# Patient Record
Sex: Male | Born: 1960 | Race: White | Hispanic: No | Marital: Married
Health system: Southern US, Community
[De-identification: ages and names within clinical notes are randomized; demographics above are authoritative.]

---

## 2021-05-21 ENCOUNTER — Encounter (HOSPITAL_COMMUNITY): Payer: Self-pay

## 2021-05-21 ENCOUNTER — Emergency Department (HOSPITAL_COMMUNITY)
Admission: EM | Admit: 2021-05-21 | Discharge: 2021-05-22 | Disposition: A | Discharge status: Home / Self Care | Payer: BC Managed Care – PPO | Attending: Emergency Medicine | Admitting: Emergency Medicine

## 2021-05-21 ENCOUNTER — Emergency Department (HOSPITAL_COMMUNITY): Payer: BC Managed Care – PPO

## 2021-05-21 DIAGNOSIS — M545 Low back pain, unspecified: Secondary | ICD-10-CM | POA: Insufficient documentation

## 2021-05-21 DIAGNOSIS — C720 Malignant neoplasm of spinal cord: Secondary | ICD-10-CM | POA: Diagnosis not present

## 2021-05-21 DIAGNOSIS — I10 Essential (primary) hypertension: Secondary | ICD-10-CM | POA: Diagnosis not present

## 2021-05-21 DIAGNOSIS — D492 Neoplasm of unspecified behavior of bone, soft tissue, and skin: Secondary | ICD-10-CM

## 2021-05-21 DIAGNOSIS — M5442 Lumbago with sciatica, left side: Secondary | ICD-10-CM

## 2021-05-21 MED ORDER — FENTANYL CITRATE PF 50 MCG/ML IJ SOSY
50.0000 ug | PREFILLED_SYRINGE | Freq: Once | INTRAMUSCULAR | Status: AC
  Administered 2021-05-21: 50 ug via INTRAVENOUS
  Filled 2021-05-21: qty 1

## 2021-05-21 MED ORDER — GADOBUTROL 1 MMOL/ML IV SOLN
10.0000 mL | Freq: Once | INTRAVENOUS | Status: AC | PRN
  Administered 2021-05-21: 10 mL via INTRAVENOUS

## 2021-05-21 NOTE — ED Provider Notes (Signed)
Emergency Medicine Provider Triage Evaluation Note  Antonio Woodard , a 60 y.o. male  was evaluated in triage.  Pt complains of bilateral leg pain times several days.  Patient has known spinal tumor, states his pain is normally manageable.  Every now and then he has a flare that is requiring prednisone.  His pain is made worse with sitting in certain positions.  Is unchanged with ambulation.  No numbness, tingling, urinary retention, incontinence of bowel or bladder.  Review of Systems  Positive: Back pain, bilateral leg pain Negative: Leg swelling, numbness, tingling, urinary retention, incontinence  Physical Exam  BP 110/78 (BP Location: Left Arm)   Pulse 84   Temp 98 F (36.7 C) (Oral)   Resp 18   SpO2 100%  Gen:   Awake, no distress   Resp:  Normal effort  MSK:   Moves extremities without difficulty  Other:  5/5 strength in all extremities, sensation intact bilaterally  Medical Decision Making  Medically screening exam initiated at 5:47 PM.  Appropriate orders placed.  Antonio Woodard was informed that the remainder of the evaluation will be completed by another provider, this initial triage assessment does not replace that evaluation, and the importance of remaining in the ED until their evaluation is complete.  Discussed with attending physician Dr. Pearline Cables, plan to obtain lumbar MRI    Antonio Plummer, PA-C 05/21/21 [place], DO 05/21/21 2248

## 2021-05-21 NOTE — ED Triage Notes (Signed)
Pt arrived via POV, states has a known spine tumor, pain normally manageable, states he had several flights were he was sitting in positions for extended periods of time, now pain worsening. States took prednisone with no relief.

## 2021-05-22 ENCOUNTER — Telehealth (HOSPITAL_COMMUNITY): Payer: Self-pay | Admitting: Emergency Medicine

## 2021-05-22 MED ORDER — PREDNISONE 50 MG PO TABS: 50.0000 mg | ORAL_TABLET | Freq: Every day | ORAL | 0 refills | Status: AC

## 2021-05-22 MED ORDER — HYDROCODONE-ACETAMINOPHEN 5-325 MG PO TABS: 1.0000 | ORAL_TABLET | ORAL | 0 refills | Status: DC | PRN

## 2021-05-22 MED ORDER — METHYLPREDNISOLONE SODIUM SUCC 125 MG IJ SOLR
125.0000 mg | Freq: Once | INTRAMUSCULAR | Status: AC
  Administered 2021-05-22: 125 mg via INTRAVENOUS
  Filled 2021-05-22: qty 2

## 2021-05-22 MED ORDER — HYDROCODONE-ACETAMINOPHEN 5-325 MG PO TABS: 1.0000 | ORAL_TABLET | Freq: Four times a day (QID) | ORAL | 0 refills | Status: AC | PRN

## 2021-05-22 MED ORDER — HYDROCODONE-ACETAMINOPHEN 5-325 MG PO TABS
1.0000 | ORAL_TABLET | Freq: Once | ORAL | Status: AC
  Administered 2021-05-22: 1 via ORAL
  Filled 2021-05-22: qty 1

## 2021-05-22 NOTE — Discharge Instructions (Signed)
Please see your neurosurgeon after you return to [place].

## 2021-05-22 NOTE — ED Provider Notes (Signed)
[facility] DEPT Provider Note   CSN: [id number] Arrival date & time: 05/21/21  1634     History Chief Complaint  Patient presents with   Back Pain    Antonio Woodard is a 60 y.o. male.  The history is provided by the patient.  Back Pain He has a history of a tumor of the lumbar spine which is being followed by a neurosurgeon in [place].  He traveled here to attend the furniture market, but noted severe low back pain on trying to get off of the plane.  Pain shoots down his left leg and it is unbearable.  He rates it as high as 8/10.  He is able to find a position where he is pain-free, but pain gets much worse as he tries to stand up or walk.  He denies any weakness or numbness and denies any bowel or bladder dysfunction.  This has happened before and usually responds to a course of steroids.  He called his neurosurgeon who prescribed a Medrol Dosepak which has not helped.   History reviewed. No pertinent past medical history.  There are no problems to display for this patient.   ** The histories are not reviewed yet. Please review them in the "History" navigator section and refresh this [place].   History reviewed. No pertinent family history.     Home Medications Prior to Admission medications   Not on File    Allergies    Patient has no known allergies.  Review of Systems   Review of Systems  Musculoskeletal:  Positive for back pain.  All other systems reviewed and are negative.  Physical Exam Updated Vital Signs BP 110/78 (BP Location: Left Arm)   Pulse 84   Temp 98 F (36.7 C) (Oral)   Resp 18   SpO2 100%   Physical Exam Vitals and nursing note reviewed.  60 year old male, resting comfortably and in no acute distress. Vital signs are significant for elevated blood pressure. Oxygen saturation is 100%, which is normal. Head is normocephalic and atraumatic. PERRLA, EOMI. Oropharynx is clear. Neck is nontender and supple  without adenopathy or JVD. Back is nontender and there is no CVA tenderness.  Straight leg raise is positive on the left at 45 degrees. Lungs are clear without rales, wheezes, or rhonchi. Chest is nontender. Heart has regular rate and rhythm without murmur. Abdomen is soft, flat, nontender. Extremities have no cyanosis or edema, full range of motion is present. Skin is warm and dry without rash. Neurologic: Mental status is normal, cranial nerves are intact, there are no motor or sensory deficits.  ED Results / Procedures / Treatments    Radiology MR Lumbar Spine W Wo Contrast  Result Date: 05/21/2021 CLINICAL DATA:  Known spinal tumor, concern for progression EXAM: MRI LUMBAR SPINE WITHOUT AND WITH CONTRAST TECHNIQUE: Multiplanar and multiecho pulse sequences of the lumbar spine were obtained without and with intravenous contrast. CONTRAST:  68mL GADAVIST GADOBUTROL 1 MMOL/ML IV SOLN COMPARISON:  No prior MRI available for comparison. FINDINGS: Evaluation is somewhat limited by motion artifact. Segmentation: Standard. Alignment: Straightening of the normal lumbar lordosis. No listhesis. Vertebrae: No fracture, evidence of discitis, or bone lesion. No abnormal enhancement. Conus medullaris and cauda equina: Conus extends to the L1 level. At the L2 level, with in the spinal canal, there is a T1 isointense, T2 heterogeneous, avidly enhancing lesion that measures up to 1.0 x 1.6 x 2.3 cm (AP x TR x CC) (series 7, image  14 and series 10, image 9). Possible dural tail. The lesion occupies the majority of the AP diameter of the spinal canal, causing significant crowding and displacement of the nerve roots at this level, with tortuous nerve roots seen superior to L2. No abnormal nerve root enhancement. Paraspinal and other soft tissues: Negative. Disc levels: T12-L1: No significant disc bulge. No spinal canal stenosis or neural foraminal narrowing. L1-L2: No significant disc bulge. Intradural mass, as  described above. No osseous spinal canal stenosis. No neural foraminal narrowing. L2-L3: No significant disc bulge. No spinal canal stenosis or neural foraminal narrowing. L3-L4: Mild disc desiccation and mild disc bulge. Mild facet arthropathy. No spinal canal stenosis or neural foraminal narrowing. L4-L5: No significant disc bulge. No spinal canal stenosis or neural foraminal narrowing. L5-S1: Minimal disc bulge. No spinal canal stenosis or neural foraminal narrowing. IMPRESSION: Intradural lesion, measuring up to 2.3 cm at the L2 level, which causes significant crowding and displacement of the nerve roots. This could represent a meningioma, schwannoma, or myxopapillary ependymoma. Without prior imaging for comparison, it is difficult to determine if this has progressed. Electronically Signed   By: Merilyn Baba M.D.   On: 05/21/2021 19:47    Procedures Procedures   Medications Ordered in ED Medications  HYDROcodone-acetaminophen (NORCO/VICODIN) 5-325 MG per tablet 1 tablet (has no administration in time range)  methylPREDNISolone sodium succinate (SOLU-MEDROL) 125 mg/2 mL injection 125 mg (has no administration in time range)  fentaNYL (SUBLIMAZE) injection 50 mcg (50 mcg Intravenous Given 05/21/21 1845)  gadobutrol (GADAVIST) 1 MMOL/ML injection 10 mL (10 mLs Intravenous Contrast Given 05/21/21 1908)    ED Course  I have reviewed the triage vital signs and the nursing notes.  Pertinent imaging results that were available during my care of the patient were reviewed by me and considered in my medical decision making (see chart for details).   MDM Rules/Calculators/A&P                         Low back pain with left-sided sciatica secondary to known spinal tumor.  MRI had been obtained showing a 2.3 cm intradural lesion at the L2 level causing crowding and displacement of nerve roots and apparently is what is causing his pain.  Unfortunately, records from his neurosurgeon in [place] are not  available through [facility].  He will be given a dose of methylprednisolone and discharged on a higher dose of prednisone, will also give prescription for hydrocodone-acetaminophen for pure pain control.  Final Clinical Impression(s) / ED Diagnoses Final diagnoses:  Low back pain with neuralgia of left sciatic nerve  Lumbar spine tumor  Elevated blood pressure reading with diagnosis of hypertension    Rx / DC Orders ED Discharge Orders          Ordered    HYDROcodone-acetaminophen (NORCO) 5-325 MG tablet  Every 4 hours PRN        05/22/21 0321    predniSONE (DELTASONE) 50 MG tablet  Daily        05/22/21 6295             Delora Fuel, MD 28/41/32 [phone]

## 2021-05-22 NOTE — ED Notes (Signed)
Patient has no concerns after AVS has been reviewed and patient education provided. Patient discharged. Medical records number provided

## 2021-05-22 NOTE — Telephone Encounter (Signed)
Pt called, said pharmacy has the prednisone rx but not the pain med. I called pharmacy, they only see the prednisone rx. They do have hydrocodone-acetam in stock, but no rx. In Epic, it appears for some reason hydrocodone/acet rx did not transmit. Will send now. Reviewed database.

## 2022-10-06 IMAGING — MR MR LUMBAR SPINE WO/W CM
6 of 7 series · 32 of 48 positions shown · IV contrast (gadavist)
Comparison: No prior MRI available for comparison.

CLINICAL DATA: Known spinal tumor, concern for progression

EXAM:
MRI LUMBAR SPINE WITHOUT AND WITH CONTRAST
TECHNIQUE: Multiplanar and multiecho pulse sequences of the lumbar spine were
obtained without and with intravenous contrast.
CONTRAST:  10mL GADAVIST GADOBUTROL 1 MMOL/ML IV SOLN

[Series 5: T1 · sagittal · 4.0mm · 0.81mm/px · 4 of 17 slices shown (1 of 2)]
[im 1/17]
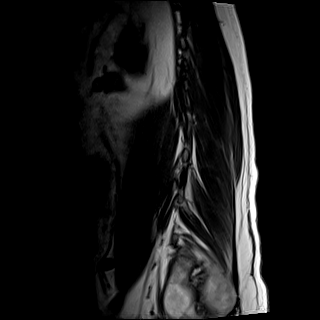
[im 6/17]
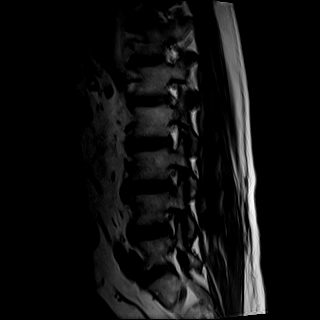
[im 11/17]
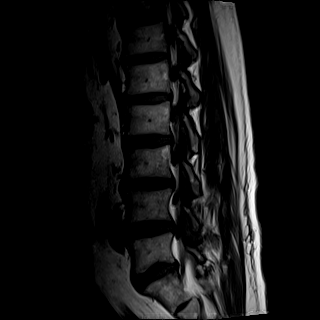
[im 17/17]
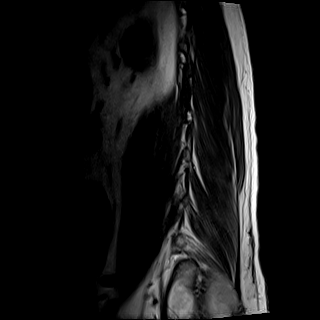

[Series 6: STIR · sagittal · 4.0mm · 0.51mm/px · 1 of 17 slices shown]
[im 1/17]
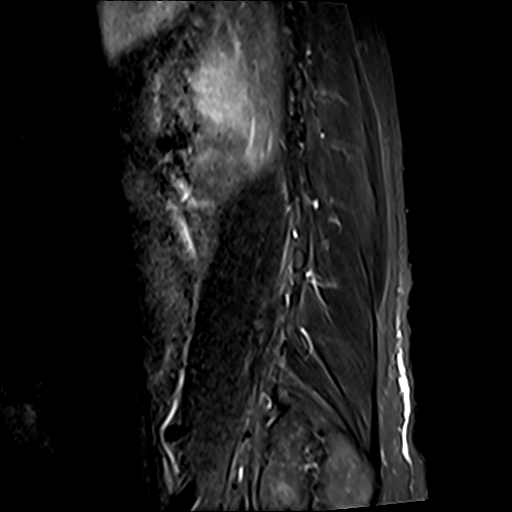

[Series 7: T2 · axial · 4.0mm · 0.62mm/px · z∈[-49,+157]mm · 11 of 43 slices shown]
[im 1/43]
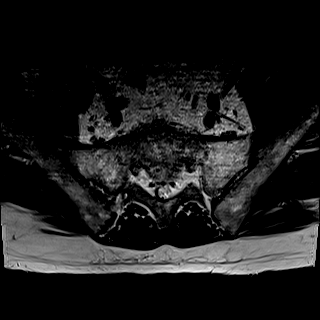
[im 5/43]
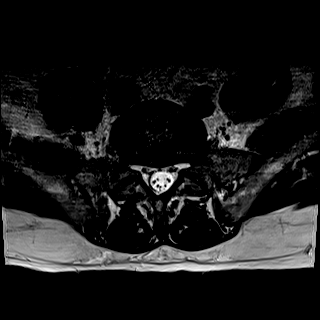
[im 9/43]
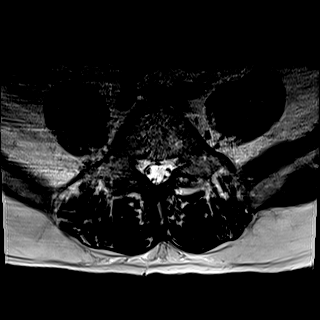
[im 13/43]
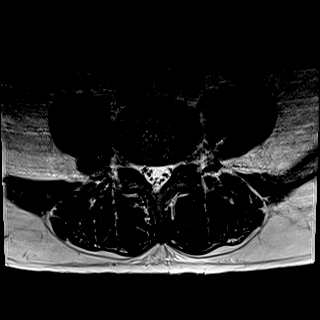
[im 17/43]
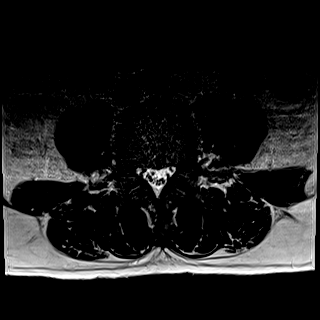
[im 22/43]
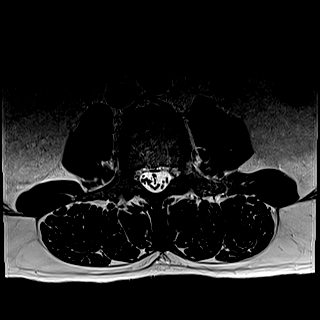
[im 26/43]
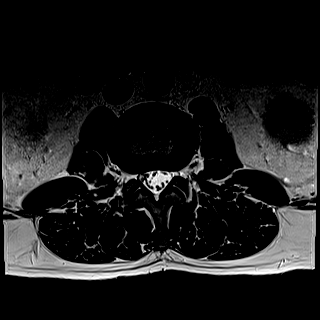
[im 30/43]
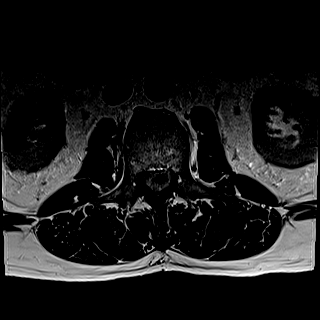
[im 34/43]
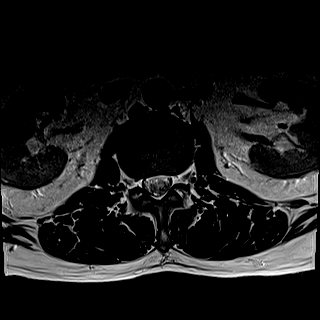
[im 38/43]
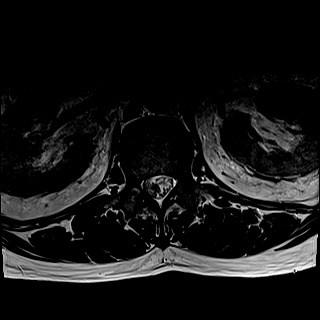
[im 43/43]
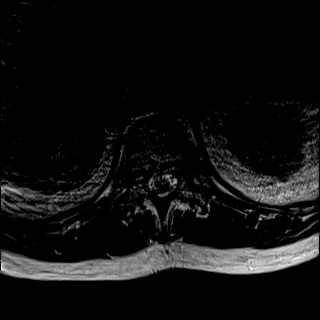

[Series 8: T1 · axial · 4.0mm · 0.39mm/px · z∈[-49,+157]mm · 8 of 42 slices shown (2 of 2)]
[im 1/42]
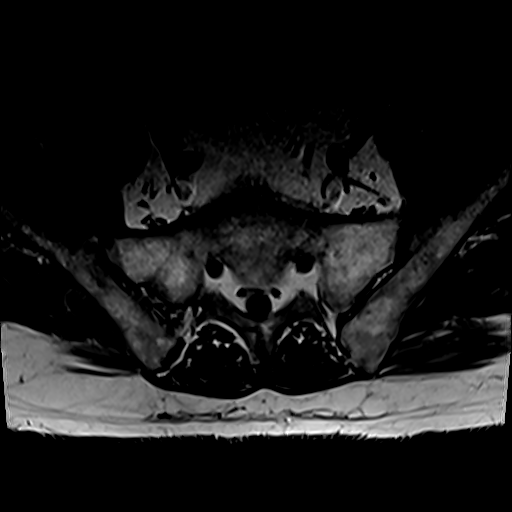
[im 5/42]
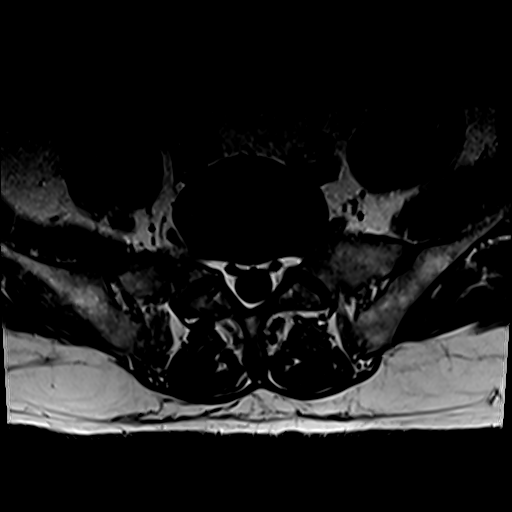
[im 14/42]
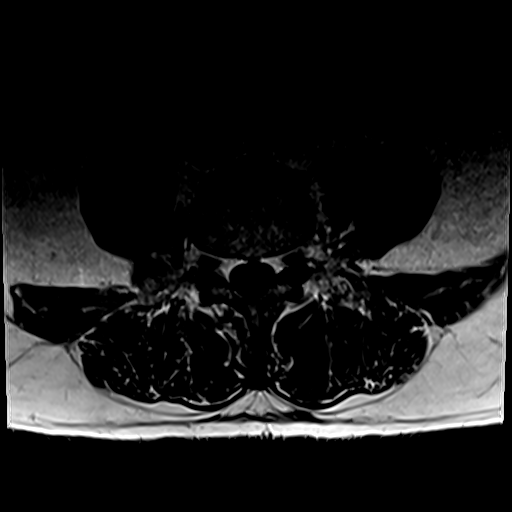
[im 19/42]
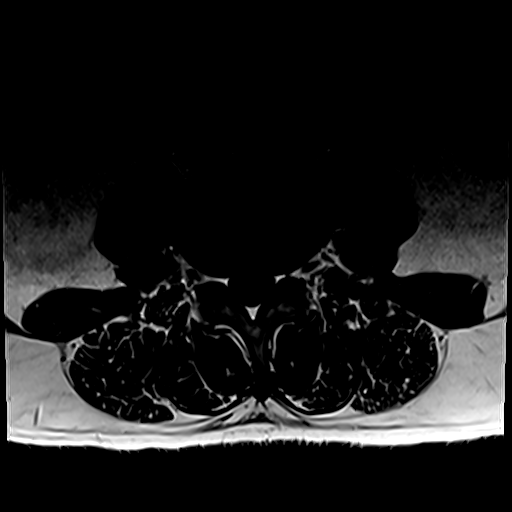
[im 23/42]
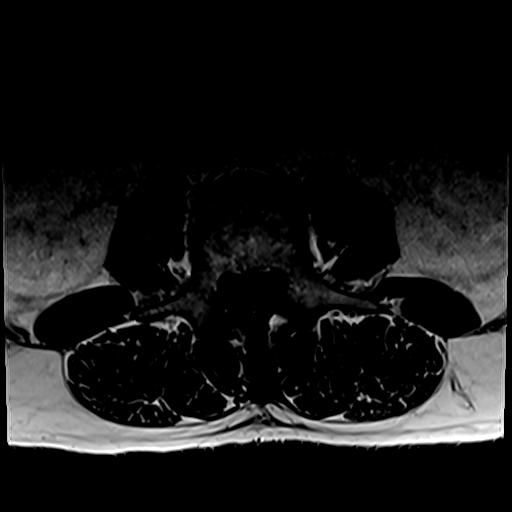
[im 28/42]
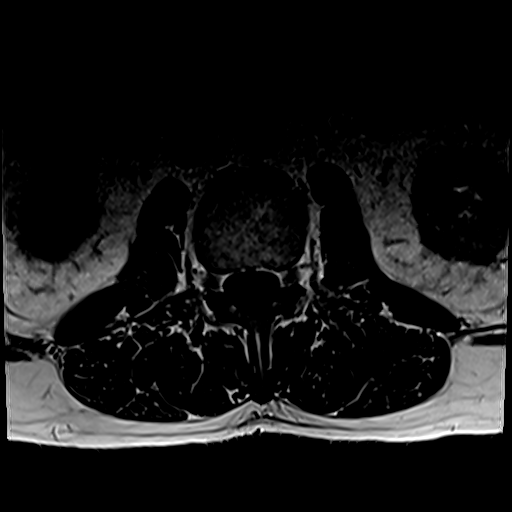
[im 37/42]
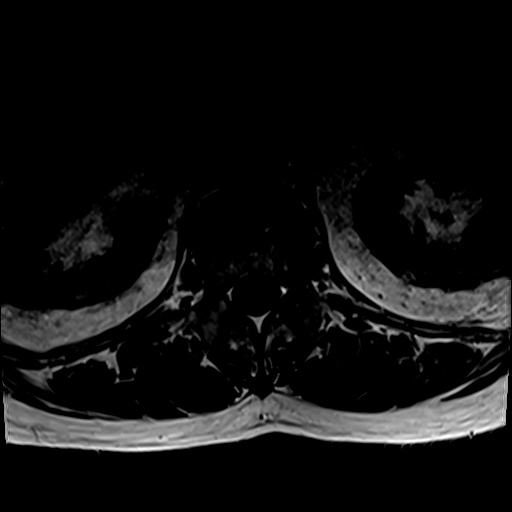
[im 42/42]
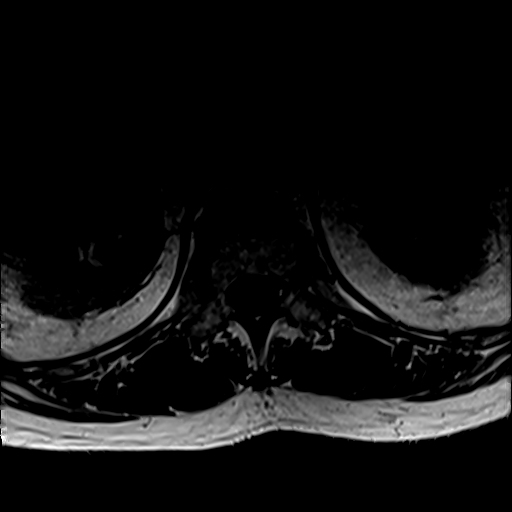

[Series 9: T2 post-contrast · sagittal · 4.0mm · 0.81mm/px · 4 of 17 slices shown]
[im 1/17]
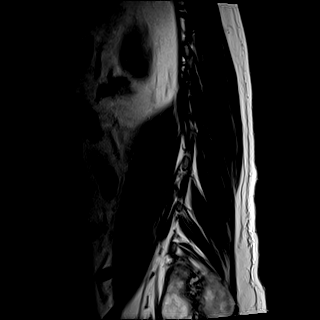
[im 6/17]
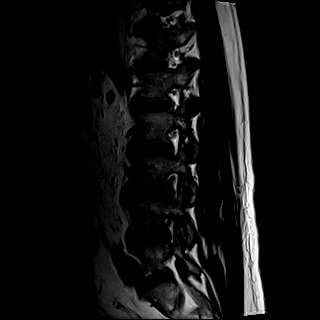
[im 11/17]
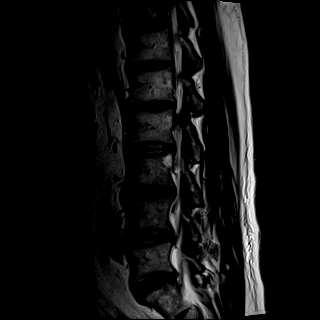
[im 17/17]
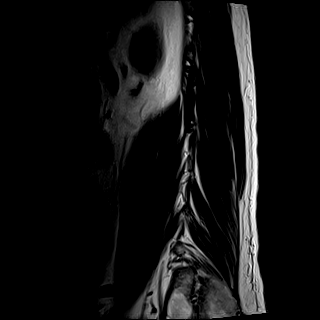

[Series 10: T1 fat-sat post-contrast · sagittal · 4.0mm · 0.81mm/px · 4 of 17 slices shown]
[im 1/17]
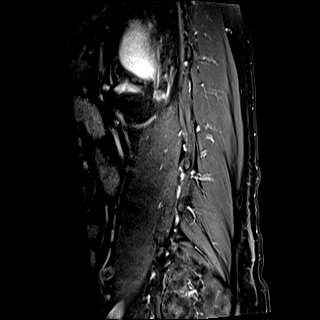
[im 6/17]
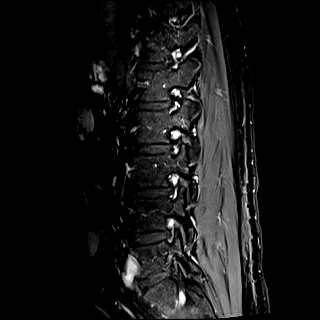
[im 11/17]
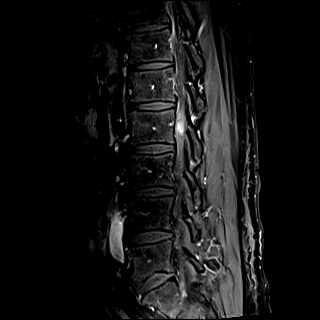
[im 17/17]
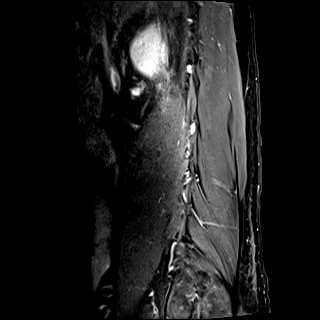

[32 of 48 positions shown; findings below may reference images not displayed]

FINDINGS: Evaluation is somewhat limited by motion artifact.

Segmentation: Standard.

Alignment: Straightening of the normal lumbar lordosis. No
listhesis.

Vertebrae: No fracture, evidence of discitis, or bone lesion. No
abnormal enhancement.

Conus medullaris and cauda equina: Conus extends to the L1 level. At
the L2 level, with in the spinal canal, there is a T1 isointense, T2
heterogeneous, avidly enhancing lesion that measures up to 1.0 x
x 2.3 cm (AP x TR x CC) (series 7, image 14 and series 10, image 9).
Possible dural tail. The lesion occupies the majority of the AP
diameter of the spinal canal, causing significant crowding and
displacement of the nerve roots at this level, with tortuous nerve
roots seen superior to L2. No abnormal nerve root enhancement.

Paraspinal and other soft tissues: Negative.

Disc levels:

T12-L1: No significant disc bulge. No spinal canal stenosis or
neural foraminal narrowing.

L1-L2: No significant disc bulge. Intradural mass, as described
above. No osseous spinal canal stenosis. No neural foraminal
narrowing.

L2-L3: No significant disc bulge. No spinal canal stenosis or neural
foraminal narrowing.

L3-L4: Mild disc desiccation and mild disc bulge. Mild facet
arthropathy. No spinal canal stenosis or neural foraminal narrowing.

L4-L5: No significant disc bulge. No spinal canal stenosis or neural
foraminal narrowing.

L5-S1: Minimal disc bulge. No spinal canal stenosis or neural
foraminal narrowing.
IMPRESSION: Intradural lesion, measuring up to 2.3 cm at the L2 level, which
causes significant crowding and displacement of the nerve roots.
This could represent a meningioma, schwannoma, or myxopapillary
ependymoma. Without prior imaging for comparison, it is difficult to
determine if this has progressed.
